# Patient Record
Sex: Male | Born: 1980 | Race: Black or African American | Hispanic: No | Marital: Single | Smoking: Current some day smoker
Health system: Southern US, Community
[De-identification: ages and names within clinical notes are randomized; demographics above are authoritative.]

## PROBLEM LIST (undated history)

## (undated) DIAGNOSIS — I10 Essential (primary) hypertension: Secondary | ICD-10-CM

## (undated) DIAGNOSIS — J45909 Unspecified asthma, uncomplicated: Secondary | ICD-10-CM

---

## 2015-09-13 ENCOUNTER — Emergency Department (HOSPITAL_COMMUNITY)
Admission: EM | Admit: 2015-09-13 | Discharge: 2015-09-13 | Disposition: A | Discharge status: Home / Self Care | Payer: Self-pay | Attending: Emergency Medicine | Admitting: Emergency Medicine

## 2015-09-13 ENCOUNTER — Encounter (HOSPITAL_COMMUNITY): Payer: Self-pay | Admitting: Emergency Medicine

## 2015-09-13 ENCOUNTER — Emergency Department (HOSPITAL_COMMUNITY): Payer: Self-pay

## 2015-09-13 DIAGNOSIS — J45901 Unspecified asthma with (acute) exacerbation: Secondary | ICD-10-CM | POA: Insufficient documentation

## 2015-09-13 DIAGNOSIS — F17210 Nicotine dependence, cigarettes, uncomplicated: Secondary | ICD-10-CM | POA: Insufficient documentation

## 2015-09-13 DIAGNOSIS — Z79899 Other long term (current) drug therapy: Secondary | ICD-10-CM | POA: Insufficient documentation

## 2015-09-13 DIAGNOSIS — I10 Essential (primary) hypertension: Secondary | ICD-10-CM | POA: Insufficient documentation

## 2015-09-13 LAB — I-STAT CHEM 8, ED
BUN: 9 mg/dL (ref 6–20)
CALCIUM ION: 1.19 mmol/L (ref 1.12–1.23)
Chloride: 104 mmol/L (ref 101–111)
Creatinine, Ser: 0.7 mg/dL (ref 0.61–1.24)
GLUCOSE: 97 mg/dL (ref 65–99)
HCT: 53 % — ABNORMAL HIGH (ref 39.0–52.0)
Hemoglobin: 18 g/dL — ABNORMAL HIGH (ref 13.0–17.0)
Potassium: 4.1 mmol/L (ref 3.5–5.1)
SODIUM: 141 mmol/L (ref 135–145)
TCO2: 24 mmol/L (ref 0–100)

## 2015-09-13 MED ORDER — AMLODIPINE BESYLATE 5 MG PO TABS: 5.0000 mg | ORAL_TABLET | Freq: Every day | ORAL | Status: DC

## 2015-09-13 MED ORDER — ALBUTEROL SULFATE HFA 108 (90 BASE) MCG/ACT IN AERS: 1.0000 | INHALATION_SPRAY | Freq: Four times a day (QID) | RESPIRATORY_TRACT | Status: DC | PRN

## 2015-09-13 MED ORDER — ALBUTEROL SULFATE (2.5 MG/3ML) 0.083% IN NEBU
5.0000 mg | INHALATION_SOLUTION | Freq: Once | RESPIRATORY_TRACT | Status: AC
  Administered 2015-09-13: 5 mg via RESPIRATORY_TRACT
  Filled 2015-09-13: qty 6

## 2015-09-13 NOTE — ED Notes (Addendum)
Patient states asthma that started to flare Thursday before Christmas.   Patient states out of medications, states he thought "i had grown out of asthma".  Patient NAD, talking in full sentences.

## 2015-09-13 NOTE — Discharge Instructions (Signed)
You were seen in the ER today for an asthma exacerbation. Your chest x-ray was normal. Your blood pressure has been elevated. I checked some blood work to make sure your blood pressure has not cause kidney damage, and the blood work looks normal. Please follow up with a primary care provider within one week. If you do not have one you may call one of the clinics below to establish care. In the meantime I will start you on a low dose blood pressure medicine. Return to the ER for new or worsening symptoms.  Take medications as prescribed. Return to the emergency room for worsening condition or new concerning symptoms. Follow up with your regular doctor. If you don't have a regular doctor use one of the numbers below to establish a primary care doctor.   Emergency Department Resource Guide 1) Find a Doctor and Pay Out of Pocket Although you won't have to find out who is covered by your insurance plan, it is a good idea to ask around and get recommendations. You will then need to call the office and see if the doctor you have chosen will accept you as a new patient and what types of options they offer for patients who are self-pay. Some doctors offer discounts or will set up payment plans for their patients who do not have insurance, but you will need to ask so you aren't surprised when you get to your appointment.  2) Contact Your Local Health Department Not all health departments have doctors that can see patients for sick visits, but many do, so it is worth a call to see if yours does. If you don't know where your local health department is, you can check in your phone book. The CDC also has a tool to help you locate your state's health department, and many state websites also have listings of all of their local health departments.  3) Find a Walk-in Clinic If your illness is not likely to be very severe or complicated, you may want to try a walk in clinic. These are popping up all over the country in  pharmacies, drugstores, and shopping centers. They're usually staffed by nurse practitioners or physician assistants that have been trained to treat common illnesses and complaints. They're usually fairly quick and inexpensive. However, if you have serious medical issues or chronic medical problems, these are probably not your best option.  No Primary Care Doctor: - Call Health Connect at  [phone] - they can help you locate a primary care doctor that  accepts your insurance, provides certain services, etc. - Physician Referral [phone]- [phone]  Emergency Department Resource Guide 1) Find a Doctor and Pay Out of Pocket Although you won't have to find out who is covered by your insurance plan, it is a good idea to ask around and get recommendations. You will then need to call the office and see if the doctor you have chosen will accept you as a new patient and what types of options they offer for patients who are self-pay. Some doctors offer discounts or will set up payment plans for their patients who do not have insurance, but you will need to ask so you aren't surprised when you get to your appointment.  2) Contact Your Local Health Department Not all health departments have doctors that can see patients for sick visits, but many do, so it is worth a call to see if yours does. If you don't know where your local health department is, you can  check in your phone book. The CDC also has a tool to help you locate your state's health department, and many state websites also have listings of all of their local health departments.  3) Find a Walk-in Clinic If your illness is not likely to be very severe or complicated, you may want to try a walk in clinic. These are popping up all over the country in pharmacies, drugstores, and shopping centers. They're usually staffed by nurse practitioners or physician assistants that have been trained to treat common illnesses and complaints. They're usually fairly  quick and inexpensive. However, if you have serious medical issues or chronic medical problems, these are probably not your best option.  No Primary Care Doctor: - Call Health Connect at  [phone] - they can help you locate a primary care doctor that  accepts your insurance, provides certain services, etc. - Physician Referral Service- [phone]  Chronic Pain Problems: Organization         Address  Phone   Notes  [facility]  [phone] Patients need to be referred by their primary care doctor.   Medication Assistance: Organization         Address  Phone   Notes  [facility] Medication [facility] [address] [place] [place], [place] [address] [phone] --Must be a resident of [facility] -- Must have NO insurance coverage whatsoever (no Medicaid/ Medicare, etc.) -- The pt. MUST have a primary care doctor that directs their care regularly and follows them in the community   [facility]  [phone]   [employer]  [phone]    Agencies that provide inexpensive medical care: Organization         Address  Phone   Notes  [facility]  [phone]   [facility]    [phone]   [facility] [address] [place], [place] [address] [phone]   [facility] [address] [place] [place] [phone]   [facility]    [phone]   [facility]    [phone]   [facility]  [address], [place] Phone:  [phone], Fax:  [phone] Hours of Operation:  9 am - 6 pm, M-F.  Also accepts Medicaid/Medicare and self-pay.  [facility]  [address], [place] Phone: [phone], Fax: [phone]. Hours of Operation:  8:30 am - 5:30 pm, M-F.  Also accepts Medicaid and self-pay.  [facility] [place] [address], [place] Phone: [phone]    [facility] [address] Natasha Bence [place], [place] [phone], Ext. [phone] Mondays & Thursdays: 7-9 AM.  First 15 patients are seen on a first come, first serve basis.    Medicaid-accepting [facility] Providers:  Organization         Address  Phone   Notes  [facility] [address], [place] [phone] Also accepts self-pay patients.  [facility] [address] Laurell Josephs [place], [place]  [phone]   [facility] [address], [place] [phone]   [facility] [address], [place] [phone]   Renaye Rakers [address], [place]   [phone] Only accepts [place] patients after they have their name applied to  their card.   Self-Pay (no insurance) in [facility]:  Organization         Address  Phone   Notes  Sickle Cell Patients, [facility] [address] [place], [place] [phone]   [facility] [address] [place], [place] [phone]   [facility] [place]  [address], [place] [phone]   [facility]/Dr. Osei-Bonsu  [address] [place] or [address], [place] [phone] Phone number for both [place] and [place] locations is the same.  [facility] and [facility] [address], [place] [phone]   [facility] [address], [place] or [address] [phone]   [facility] [address], [place] [phone], phone; [phone], fax Sees patients 1st and 3rd Saturday of every month.  Must not qualify for public or private insurance (i.e. Medicaid, Medicare, [place] Health Choice, Veterans' Benefits)  Household income should be no more than 200% of the poverty level The clinic cannot treat you if you are  pregnant or think you are pregnant  Sexually transmitted diseases are not treated at the clinic.     Hypertension Hypertension, commonly called high blood pressure, is when the force of blood pumping through your arteries is too strong. Your arteries are the blood vessels that carry blood from your heart throughout your body. A blood pressure reading consists of a higher number over a lower number, such as 110/72. The higher number (systolic) is the pressure inside your arteries when your heart pumps. The lower number (diastolic) is the pressure inside your arteries when your heart relaxes. Ideally you want your blood pressure below 120/80. Hypertension forces your heart to work harder to pump blood. Your arteries may become narrow or stiff. Having untreated or uncontrolled hypertension can cause heart attack, stroke, kidney disease, and other problems. RISK FACTORS Some risk factors for high blood pressure are controllable. Others are not.  Risk factors you cannot control include:   Race. You may be at higher risk if you are African American.  Age. Risk increases with age.  Gender. Men are at higher risk than women before age 50 years. After age 45, women are at higher risk than men. Risk factors you can control include:  Not getting enough exercise or physical activity.  Being overweight.  Getting too much fat, sugar, calories, or salt in your diet.  Drinking too much alcohol. SIGNS AND SYMPTOMS Hypertension does not usually cause signs or symptoms. Extremely high blood pressure (hypertensive crisis) may cause headache, anxiety, shortness of breath, and nosebleed. DIAGNOSIS To check if you have hypertension, your health care provider will measure your blood pressure while you are seated, with your arm held at the level of your heart. It should be measured at least twice using the same arm. Certain conditions can cause a difference in blood pressure between your right and left arms. A  blood pressure reading that is higher than normal on one occasion does not mean that you need treatment. If it is not clear whether you have high blood pressure, you may be asked to return on a different day to have your blood pressure checked again. Or, you may be asked to monitor your blood pressure at home for 1 or more weeks. TREATMENT Treating high blood pressure includes making lifestyle changes and possibly taking medicine. Living a  healthy lifestyle can help lower high blood pressure. You may need to change some of your habits. Lifestyle changes may include:  Following the DASH diet. This diet is high in fruits, vegetables, and whole grains. It is low in salt, red meat, and added sugars.  Keep your sodium intake below 2,300 mg per day.  Getting at least 30-45 minutes of aerobic exercise at least 4 times per week.  Losing weight if necessary.  Not smoking.  Limiting alcoholic beverages.  Learning ways to reduce stress. Your health care provider may prescribe medicine if lifestyle changes are not enough to get your blood pressure under control, and if one of the following is true:  You are 53-68 years of age and your systolic blood pressure is above 140.  You are 66 years of age or older, and your systolic blood pressure is above 150.  Your diastolic blood pressure is above 90.  You have diabetes, and your systolic blood pressure is over 140 or your diastolic blood pressure is over 90.  You have kidney disease and your blood pressure is above 140/90.  You have heart disease and your blood pressure is above 140/90. Your personal target blood pressure may vary depending on your medical conditions, your age, and other factors. HOME CARE INSTRUCTIONS  Have your blood pressure rechecked as directed by your health care provider.   Take medicines only as directed by your health care provider. Follow the directions carefully. Blood pressure medicines must be taken as prescribed.  The medicine does not work as well when you skip doses. Skipping doses also puts you at risk for problems.  Do not smoke.   Monitor your blood pressure at home as directed by your health care provider. SEEK MEDICAL CARE IF:   You think you are having a reaction to medicines taken.  You have recurrent headaches or feel dizzy.  You have swelling in your ankles.  You have trouble with your vision. SEEK IMMEDIATE MEDICAL CARE IF:  You develop a severe headache or confusion.  You have unusual weakness, numbness, or feel faint.  You have severe chest or abdominal pain.  You vomit repeatedly.  You have trouble breathing. MAKE SURE YOU:   Understand these instructions.  Will watch your condition.  Will get help right away if you are not doing well or get worse.   This information is not intended to replace advice given to you by your health care provider. Make sure you discuss any questions you have with your health care provider.   Document Released: 08/30/2005 Document Revised: 01/14/2015 Document Reviewed: 06/22/2013 Elsevier Interactive Patient Education [employer]

## 2015-09-16 NOTE — ED Provider Notes (Signed)
CSN: [id number]     Arrival date & time 09/13/15  1208 History   First MD Initiated Contact with Patient 09/13/15 1314     Chief Complaint  Patient presents with  . Asthma     HPI  Shaun Gallegos is an 35 y.o. male with distant history of asthma who presents to the ED for evaluation of asthma. He states that he has not had an inhaler or needed to use one in "many years." However, over christmas he was watching his brother's dog which triggered his asthma. He states he does not feel short of breath but feels like he is wheezing and not breathing quite right. He states he knows his asthma used to be triggered by pets as well. Denies chest pain. Denies cough, fever, chills.   Pt's wife accompanies him today and is concerned about his blood pressure. Pt states he thinks he might have high blood pressure but has never been treated for it. He states that he and his wife just moved to the area from [place] and are in the process of establishing local PCP. Denies chest pain, headache, n/v/. Denies hematuria or other urinary issues.  History reviewed. No pertinent past medical history. History reviewed. No pertinent past surgical history. No family history on file. Social History  Substance Use Topics  . Smoking status: Current Some Day Smoker    Types: Cigars  . Smokeless tobacco: None  . Alcohol Use: No    Review of Systems  All other systems reviewed and are negative.     Allergies  Review of patient's allergies indicates no known allergies.  Home Medications   Prior to Admission medications   Medication Sig Start Date End Date Taking? Authorizing Provider  albuterol (PROVENTIL HFA;VENTOLIN HFA) 108 (90 Base) MCG/ACT inhaler Inhale 1-2 puffs into the lungs every 6 (six) hours as needed for wheezing or shortness of breath. 09/13/15   Shaun GinsSerena Y Deanglo Hissong, PA-C  amLODipine (NORVASC) 5 MG tablet Take 1 tablet (5 mg total) by mouth daily. 09/13/15   Shaun GinsSerena Y Antrell Tipler, PA-C   BP 147/98 mmHg  Pulse 100   Temp(Src) 98.1 F (36.7 C) (Oral)  Resp 17  Ht 6\' 1"  (1.854 m)  Wt 90.719 kg  BMI 26.39 kg/m2  SpO2 99% Physical Exam  Constitutional: He is oriented to person, place, and time. No distress.  HENT:  Right Ear: External ear normal.  Left Ear: External ear normal.  Nose: Nose normal.  Mouth/Throat: Oropharynx is clear and moist. No oropharyngeal exudate.  Eyes: Conjunctivae and EOM are normal. Pupils are equal, round, and reactive to light.  Neck: Normal range of motion. Neck supple.  Cardiovascular: Normal rate, regular rhythm, normal heart sounds and intact distal pulses.   Pulmonary/Chest: Effort normal. He exhibits no tenderness.  Diffuse bilateral expiratory wheezing. No crackles or rhonchi. No increased WOB, accessory muscle usage.   Abdominal: Soft. Bowel sounds are normal. He exhibits no distension. There is no tenderness.  Musculoskeletal: He exhibits no edema.  Neurological: He is alert and oriented to person, place, and time. No cranial nerve deficit.  Skin: Skin is warm and dry. He is not diaphoretic.  Psychiatric: He has a normal mood and affect.  Nursing note and vitals reviewed.   Filed Vitals:   09/13/15 1231 09/13/15 1431 09/13/15 1524  BP: 156/106 163/108 147/98  Pulse: 100 97 100  Temp: 98.1 F (36.7 C)    TempSrc: Oral    Resp: 18 18 17   Height: 6\' 1"  (  1.854 m)    Weight: 90.719 kg    SpO2: 96% 96% 99%     ED Course  Procedures (including critical care time) Labs Review Labs Reviewed  I-STAT CHEM 8, ED - Abnormal; Notable for the following:    Hemoglobin 18.0 (*)    HCT 53.0 (*)    All other components within normal limits    Imaging Review No results found. I have personally reviewed and evaluated these images and lab results as part of my medical decision-making.   EKG Interpretation None      MDM   Final diagnoses:  Asthma, unspecified asthma severity, with acute exacerbation  Essential hypertension   Pt's presentation consistent  with asthma exacerbation. His wheezing cleared completely with albuterol neb and pt subjectively feels much better. He has not been hypoxic at all. His tachycardia is likely due to albuterol neb. Will give rx for ventolin inhaler. Pt's BP has been elevated today. No evidence of end organ damage. Pt is asymptomatic. I will start him on a low dose of Norvasc and give resource guide to establish PCP. ER return precautions given.   Carlene Coria, PA-C 09/16/15 1312  Cathren Laine, MD 09/16/15 [phone]

## 2016-10-20 ENCOUNTER — Emergency Department (HOSPITAL_COMMUNITY): Payer: Self-pay

## 2016-10-20 ENCOUNTER — Encounter (HOSPITAL_COMMUNITY): Payer: Self-pay | Admitting: *Deleted

## 2016-10-20 ENCOUNTER — Emergency Department (HOSPITAL_COMMUNITY)
Admission: EM | Admit: 2016-10-20 | Discharge: 2016-10-20 | Disposition: A | Discharge status: Home / Self Care | Payer: Self-pay | Attending: Emergency Medicine | Admitting: Emergency Medicine

## 2016-10-20 DIAGNOSIS — I10 Essential (primary) hypertension: Secondary | ICD-10-CM | POA: Insufficient documentation

## 2016-10-20 DIAGNOSIS — Z79899 Other long term (current) drug therapy: Secondary | ICD-10-CM | POA: Insufficient documentation

## 2016-10-20 DIAGNOSIS — F17290 Nicotine dependence, other tobacco product, uncomplicated: Secondary | ICD-10-CM | POA: Insufficient documentation

## 2016-10-20 DIAGNOSIS — J4521 Mild intermittent asthma with (acute) exacerbation: Secondary | ICD-10-CM | POA: Insufficient documentation

## 2016-10-20 HISTORY — DX: Essential (primary) hypertension: I10

## 2016-10-20 HISTORY — DX: Unspecified asthma, uncomplicated: J45.909

## 2016-10-20 MED ORDER — IPRATROPIUM-ALBUTEROL 0.5-2.5 (3) MG/3ML IN SOLN
RESPIRATORY_TRACT | Status: AC
  Filled 2016-10-20: qty 3

## 2016-10-20 MED ORDER — PREDNISONE 20 MG PO TABS
60.0000 mg | ORAL_TABLET | Freq: Once | ORAL | Status: AC
  Administered 2016-10-20: 60 mg via ORAL
  Filled 2016-10-20: qty 3

## 2016-10-20 MED ORDER — ALBUTEROL SULFATE HFA 108 (90 BASE) MCG/ACT IN AERS: 1.0000 | INHALATION_SPRAY | Freq: Four times a day (QID) | RESPIRATORY_TRACT | 0 refills | Status: DC | PRN

## 2016-10-20 MED ORDER — PREDNISONE 20 MG PO TABS: 40.0000 mg | ORAL_TABLET | Freq: Every day | ORAL | 0 refills | Status: DC

## 2016-10-20 MED ORDER — AMLODIPINE BESYLATE 5 MG PO TABS: 5.0000 mg | ORAL_TABLET | Freq: Every day | ORAL | 0 refills | Status: AC

## 2016-10-20 MED ORDER — IPRATROPIUM-ALBUTEROL 0.5-2.5 (3) MG/3ML IN SOLN
3.0000 mL | Freq: Once | RESPIRATORY_TRACT | Status: AC
  Administered 2016-10-20: 3 mL via RESPIRATORY_TRACT

## 2016-10-20 MED ORDER — ALBUTEROL SULFATE HFA 108 (90 BASE) MCG/ACT IN AERS
2.0000 | INHALATION_SPRAY | Freq: Once | RESPIRATORY_TRACT | Status: AC
  Administered 2016-10-20: 2 via RESPIRATORY_TRACT
  Filled 2016-10-20: qty 6.7

## 2016-10-20 NOTE — ED Notes (Signed)
Pt stable, understands discharge instructions, and reasons for return.   

## 2016-10-20 NOTE — ED Triage Notes (Signed)
No wheezing noted after treatment

## 2016-10-20 NOTE — ED Notes (Signed)
No signature pad available 

## 2016-10-20 NOTE — ED Triage Notes (Signed)
Pt reports wheezing and asthma starting several days ago. Pt states that he has a dr appt on the 12th. Pt reports having to use a friends inhaler. Inspiratory and expiratory wheezing noted.

## 2016-10-20 NOTE — Discharge Instructions (Signed)
Take the prednisone 2 tablets a day starting tomorrow for 4 days. Use the albuterol inhaler as needed. I have given you a prescription for your blood pressure medicine. Please follow-up with her primary care doctor next week. Return to the ED if he develops any worsening symptoms.   Follow these instructions at home: Take over-the-counter and prescription medicines only as told by your health care provider. Use a cool mist humidifier to add humidity to the air in your home. This can make breathing easier. Rest as needed. Drink enough fluid to keep your urine clear or pale yellow. Cover your mouth and nose when you cough or sneeze. Wash your hands with soap and water often, especially after you cough or sneeze. If soap and water are not available, use hand sanitizer. Stay home from work or school as told by your health care provider. Unless you are visiting your health care provider, try to avoid leaving home until your fever has been gone for 24 hours without the use of medicine. Keep all follow-up visits as told by your health care provider. This is important. Contact a health care provider if: You develop new symptoms. You have: Chest pain. Diarrhea. A fever. Your cough gets worse. You produce more mucus. You feel nauseous or you vomit. Get help right away if: You develop shortness of breath or difficulty breathing. Your skin or nails turn a bluish color. You have severe pain or stiffness in your neck. You develop a sudden headache or sudden pain in your face or ear. You cannot stop vomiting.

## 2016-10-20 NOTE — ED Provider Notes (Signed)
[facility]EMER[facility]ENCY DEPT Provider Note   CSN: [id number] Arrival date & time: 10/20/16  1217     History   Chief Complaint Chief Complaint  Patient presents with  . Asthma    HPI Shaun Gallegos is a 36 y.o. male.  36 year old African-American male past medical history significant for asthma and hypertension presents to the ED today with complaint of wheezing and exacerbation of his asthma. States that his symptoms started approximately 2 days ago when he was watching the Super Bowl at his dad's house. States there is smoking in the house and he has dogs which triggered his asthma. Patient states that his chest has been intermittently tight with increased wheezing. States that he has not seen a primary care doctor for his asthma and "a few years". Does not have an albuterol inhaler at home. He has been using a friend's inhaler with some relief. States this feels like his typical asthma exacerbation. Hasn't felt fine up until his symptoms were triggered the other day. Patient also states she's been out of his blood pressure medicine for the past 3 days. He has an appointment with his primary care doctor on Monday for his asthma and hypertension. Patient denies any fever, chills, headache, vision changes, chest pain, shortness of breath, abdominal pain, nausea, vomiting, urinary symptoms, change in bowel habits. The patient was noted to have expiratory Wheezes in triage. He was given a DuoNeb with complete resolution of his lung sounds. Patient states he feels completely resolved on my initial assessment.      Past Medical History:  Diagnosis Date  . Asthma   . Hypertension     There are no active problems to display for this patient.   History reviewed. No pertinent surgical history.     Home Medications    Prior to Admission medications   Medication Sig Start Date End Date Taking? Authorizing Provider  albuterol (PROVENTIL HFA;VENTOLIN HFA) 108 (90 Base) MCG/ACT inhaler Inhale 1-2  puffs into the lungs every 6 (six) hours as needed for wheezing or shortness of breath. 10/20/16   Rise MuKenneth T Belinda Schlichting, PA-C  amLODipine (NORVASC) 5 MG tablet Take 1 tablet (5 mg total) by mouth daily. 10/20/16   Rise MuKenneth T Tearsa Kowalewski, PA-C  predniSONE (DELTASONE) 20 MG tablet Take 2 tablets (40 mg total) by mouth daily with breakfast. 10/20/16   Rise MuKenneth T Javeon Macmurray, PA-C    Family History No family history on file.  Social History Social History  Substance Use Topics  . Smoking status: Current Some Day Smoker    Types: Cigars  . Smokeless tobacco: Not on file  . Alcohol use No     Allergies   Patient has no known allergies.   Review of Systems Review of Systems  Constitutional: Negative for chills and fever.  HENT: Negative for congestion and sore throat.   Eyes: Negative for visual disturbance.  Respiratory: Positive for chest tightness and wheezing. Negative for cough and shortness of breath.   Cardiovascular: Negative for chest pain and palpitations.  Gastrointestinal: Negative for abdominal pain, diarrhea, nausea and vomiting.  Genitourinary: Negative for dysuria, flank pain, hematuria and urgency.  Musculoskeletal: Negative.   Skin: Negative.   Neurological: Negative for dizziness, syncope, weakness, light-headedness and headaches.  All other systems reviewed and are negative.    Physical Exam Updated Vital Signs BP (!) 154/110 (BP Location: Right Arm)   Pulse 91   Temp 98.7 F (37.1 C) (Oral)   Resp 16   SpO2 96%   Physical Exam  Constitutional: He is oriented to person, place, and time. He appears well-developed and well-nourished. No distress.  HENT:  Head: Normocephalic and atraumatic.  Right Ear: Tympanic membrane, external ear and ear canal normal.  Left Ear: Tympanic membrane, external ear and ear canal normal.  Nose: Nose normal.  Mouth/Throat: Uvula is midline, oropharynx is clear and moist and mucous membranes are normal.  Eyes: Conjunctivae are normal.  Right eye exhibits no discharge. Left eye exhibits no discharge. No scleral icterus.  Neck: Normal range of motion. Neck supple. No thyromegaly present.  Cardiovascular: Normal rate, regular rhythm, normal heart sounds and intact distal pulses.  Exam reveals no gallop and no friction rub.   No murmur heard. Patient with a heart rate of 85 on my exam.  Pulmonary/Chest: Effort normal and breath sounds normal. No respiratory distress. He has no wheezes. He exhibits no tenderness.  Patient was not hypoxic or tachypnea, my exam. No respiratory or expiratory wheezes were noted. No rhonchi or crackles. No retractions. No accessory muscle use. Patient is not acute distress. Speaking complete sentences and unlabored.  Abdominal: Soft. Bowel sounds are normal. He exhibits no distension. There is no tenderness. There is no rebound and no guarding.  Musculoskeletal: Normal range of motion.  Lymphadenopathy:    He has no cervical adenopathy.  Neurological: He is alert and oriented to person, place, and time.  Skin: Skin is warm and dry. Capillary refill takes less than 2 seconds.  Nursing note and vitals reviewed.    ED Treatments / Results  Labs (all labs ordered are listed, but only abnormal results are displayed) Labs Reviewed - No data to display  EKG  EKG Interpretation None       Radiology Dg Chest 2 View  Result Date: 10/20/2016 CLINICAL DATA:  Shortness of breath. EXAM: CHEST  2 VIEW COMPARISON:  Radiographs of September 13, 2015. FINDINGS: The heart size and mediastinal contours are within normal limits. Both lungs are clear. No pneumothorax or pleural effusion is noted. The visualized skeletal structures are unremarkable. IMPRESSION: No active cardiopulmonary disease. Electronically Signed   By: Lupita Raider, M.D.   On: 10/20/2016 16:00    Procedures Procedures (including critical care time)  Medications Ordered in ED Medications  ipratropium-albuterol (DUONEB) 0.5-2.5 (3)  MG/3ML nebulizer solution 3 mL (3 mLs Nebulization Given 10/20/16 1241)  predniSONE (DELTASONE) tablet 60 mg (60 mg Oral Given 10/20/16 1600)  albuterol (PROVENTIL HFA;VENTOLIN HFA) 108 (90 Base) MCG/ACT inhaler 2 puff (2 puffs Inhalation Given 10/20/16 1600)     Initial Impression / Assessment and Plan / ED Course  I have reviewed the triage vital signs and the nursing notes.  Pertinent labs & imaging results that were available during my care of the patient were reviewed by me and considered in my medical decision making (see chart for details).     Patient presents to the ED with complaints of wheezing and asthma exacerbation. States that his symptoms were triggered when he was around his family and smoke the other day. States this feels like his typical asthma exacerbation. Did not have any inhaler at home. The patient with respiratory neck sprain wheezes in triage. Given a DuoNeb with complete resolution of symptoms. Patient states he feels much improved. I ambulate her patient myself with oxygen saturation of 100%. Patient ambulated in ED with O2 saturations maintained >90, no current signs of respiratory distress. Lung exam improved after nebulizer treatment. Prednisone given in the ED and pt will bd dc with  4 day burst. Pt states they are breathing at baseline. Pt has been instructed to continue using prescribed medications and to speak with PCP about today's exacerbation. Blood pressure is slightly elevated. Patient states he has not taken his blood pressure medicine the past 2 days. He has been out of his medications. Asking for refill. He denies any chest pain, headaches, vision changes or shortness of breath. No signs of end organ damage. We'll give him a 30 day supplies medication. He is following up with his primary care doctor on Monday for his first appointment to establish care.  Vital signs within normal limits. Pt is hemodynamically stable, in NAD, & able to ambulate in the ED. Pain has  been managed & has no complaints prior to dc. Pt is comfortable with above plan and is stable for discharge at this time. All questions were answered prior to disposition. Strict return precautions for f/u to the ED were discussed.    Final Clinical Impressions(s) / ED Diagnoses   Final diagnoses:  Mild intermittent asthma with exacerbation    New Prescriptions Discharge Medication List as of 10/20/2016  4:09 PM    START taking these medications   Details  predniSONE (DELTASONE) 20 MG tablet Take 2 tablets (40 mg total) by mouth daily with breakfast., Starting Wed 10/20/2016, Print         Rise Mu, PA-C 10/20/16 1640    Raeford Razor, MD 11/01/16 [phone]

## 2017-01-25 IMAGING — CR DG CHEST 2V
2 series · 2 of 2 positions shown · non-contrast
Comparison: None.

CLINICAL DATA: Initial encounter for chest congestion since
[REDACTED].

EXAM:
CHEST  2 VIEW

[chest pa]
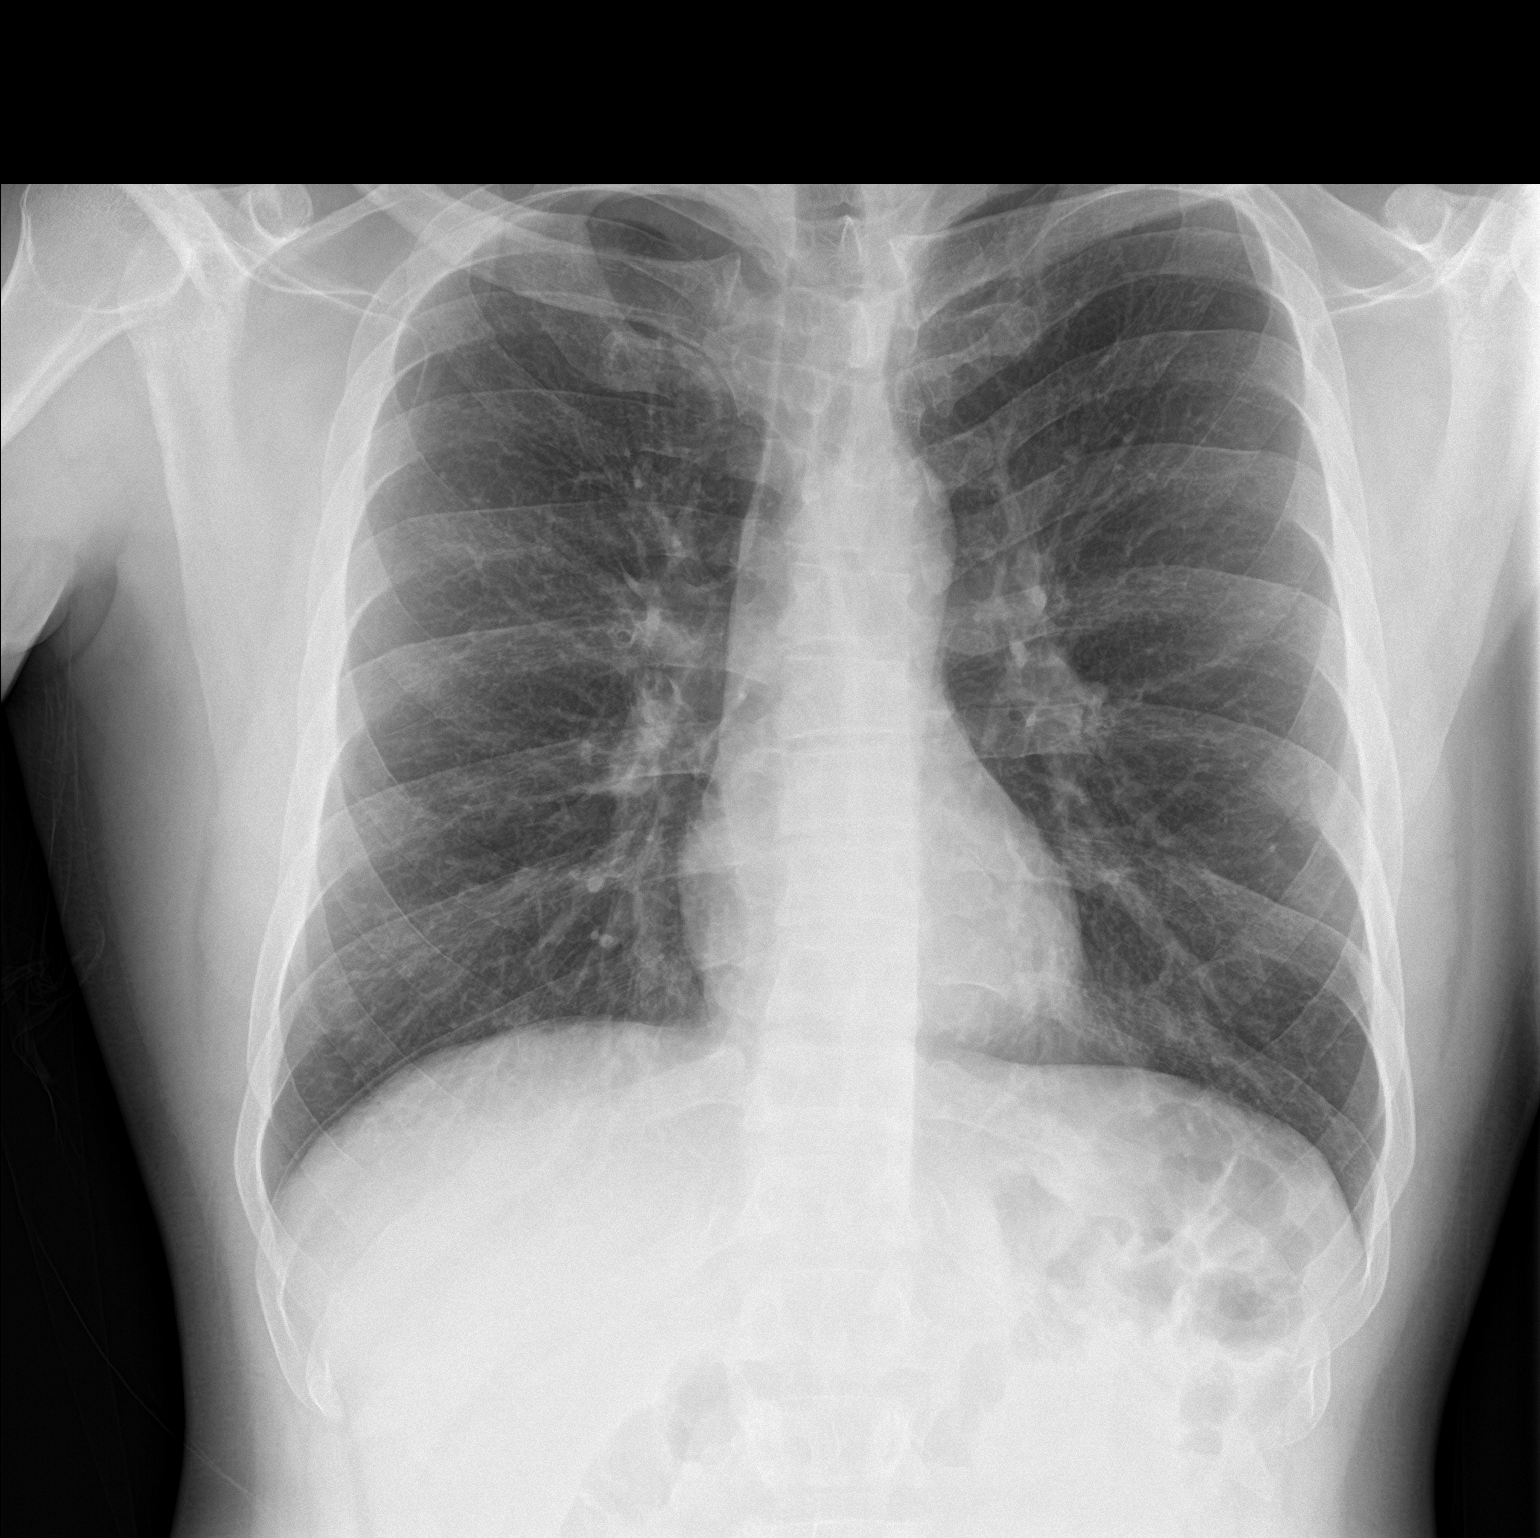

[chest lat]
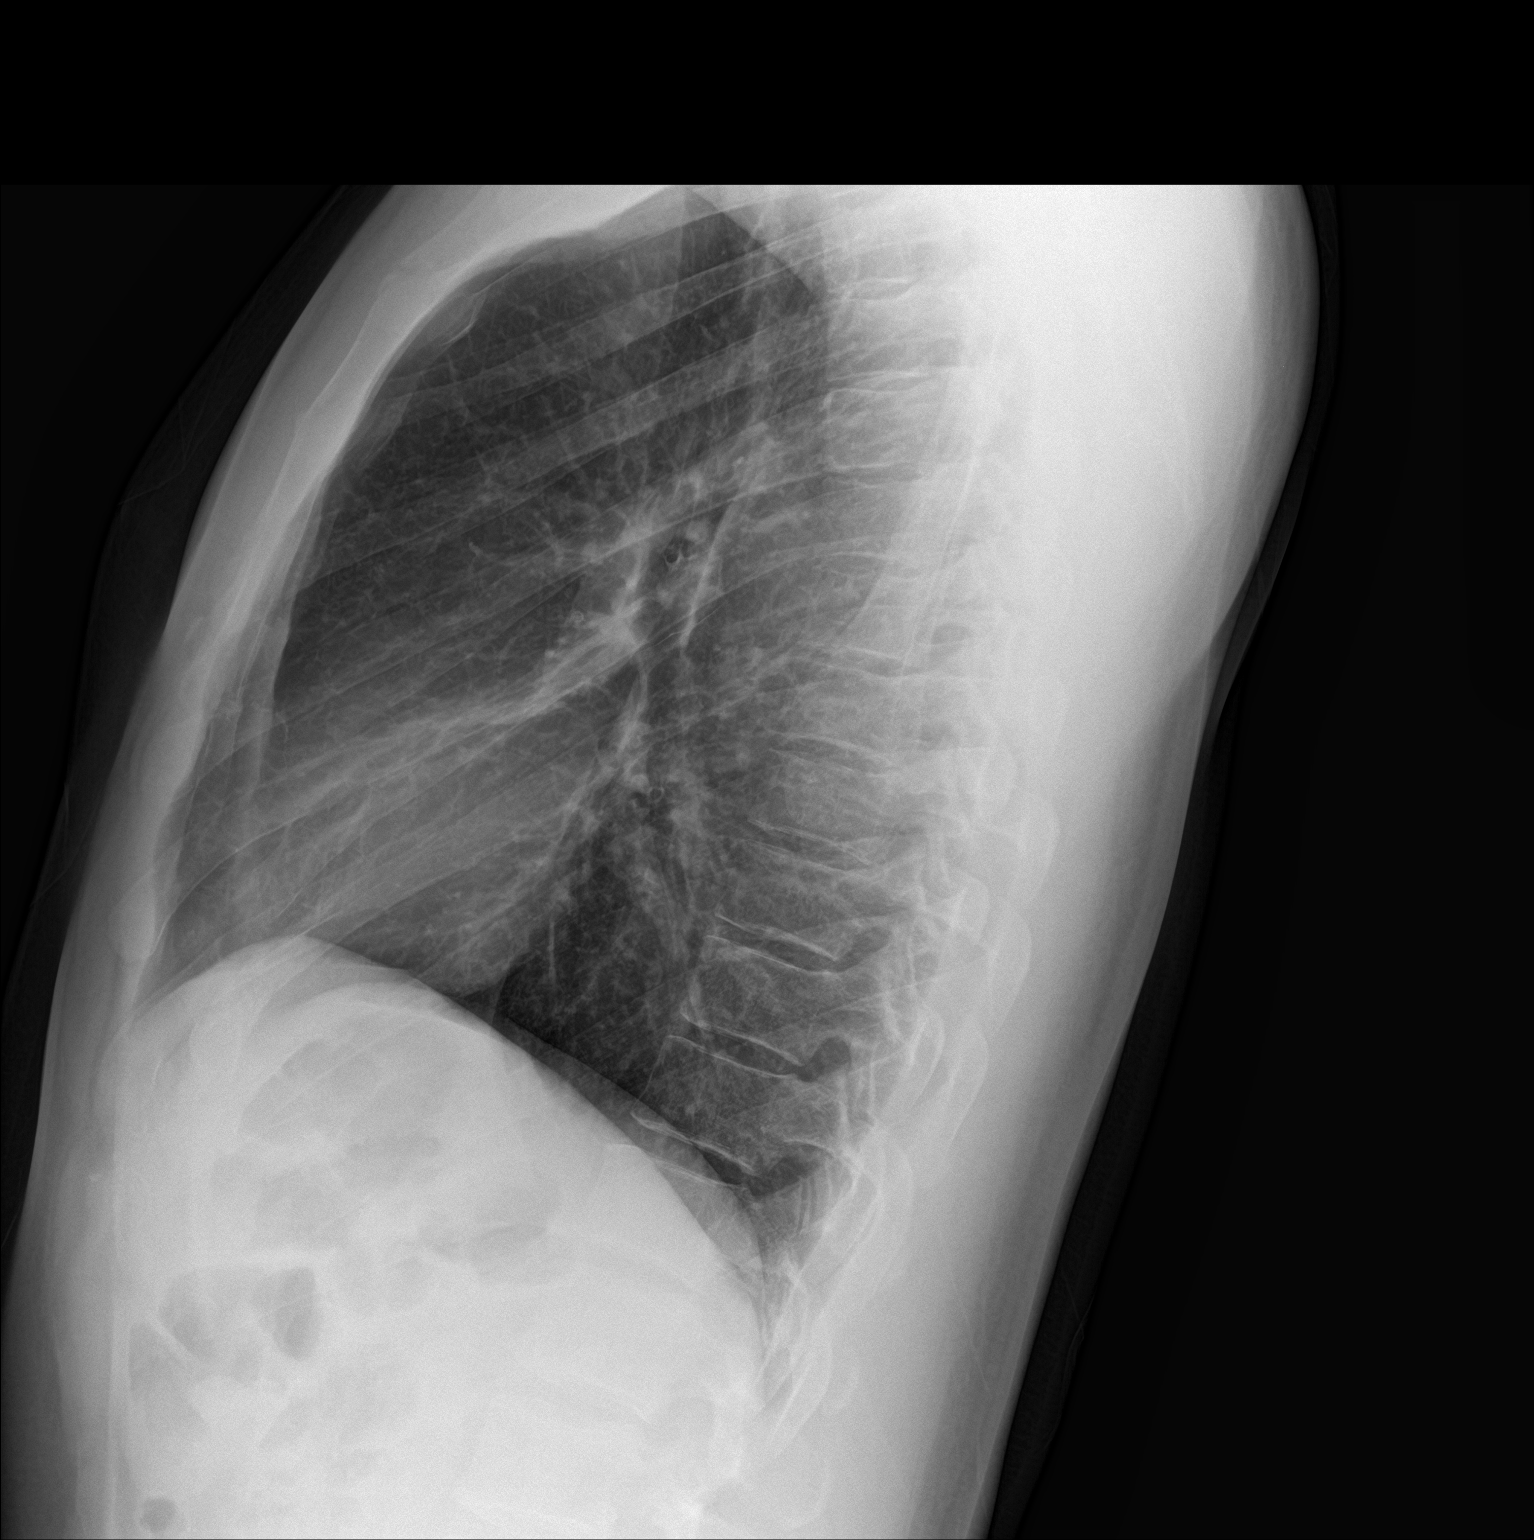

[2 of 2 positions shown; findings below may reference images not displayed]

FINDINGS: The heart size and mediastinal contours are within normal limits.
Both lungs are clear. The visualized skeletal structures are
unremarkable.
IMPRESSION: No active cardiopulmonary disease.

## 2017-02-16 ENCOUNTER — Emergency Department (HOSPITAL_COMMUNITY): Payer: Self-pay

## 2017-02-16 ENCOUNTER — Emergency Department (HOSPITAL_COMMUNITY)
Admission: EM | Admit: 2017-02-16 | Discharge: 2017-02-16 | Disposition: A | Discharge status: Home / Self Care | Payer: Self-pay | Attending: Emergency Medicine | Admitting: Emergency Medicine

## 2017-02-16 ENCOUNTER — Encounter (HOSPITAL_COMMUNITY): Payer: Self-pay

## 2017-02-16 DIAGNOSIS — F17290 Nicotine dependence, other tobacco product, uncomplicated: Secondary | ICD-10-CM | POA: Insufficient documentation

## 2017-02-16 DIAGNOSIS — Z79899 Other long term (current) drug therapy: Secondary | ICD-10-CM | POA: Insufficient documentation

## 2017-02-16 DIAGNOSIS — J45901 Unspecified asthma with (acute) exacerbation: Secondary | ICD-10-CM | POA: Insufficient documentation

## 2017-02-16 DIAGNOSIS — I10 Essential (primary) hypertension: Secondary | ICD-10-CM | POA: Insufficient documentation

## 2017-02-16 LAB — BASIC METABOLIC PANEL
Anion gap: 9 (ref 5–15)
BUN: 8 mg/dL (ref 6–20)
CALCIUM: 9.2 mg/dL (ref 8.9–10.3)
CO2: 23 mmol/L (ref 22–32)
CREATININE: 0.87 mg/dL (ref 0.61–1.24)
Chloride: 107 mmol/L (ref 101–111)
GFR calc non Af Amer: >60 mL/min (ref >60)
GLUCOSE: 108 mg/dL — AB (ref 65–99)
Potassium: 3.5 mmol/L (ref 3.5–5.1)
Sodium: 139 mmol/L (ref 135–145)

## 2017-02-16 LAB — CBC
HEMATOCRIT: 43 % (ref 39.0–52.0)
Hemoglobin: 14.4 g/dL (ref 13.0–17.0)
MCH: 27.2 pg (ref 26.0–34.0)
MCHC: 33.5 g/dL (ref 30.0–36.0)
MCV: 81.3 fL (ref 78.0–100.0)
Platelets: 279 10*3/uL (ref 150–400)
RBC: 5.29 MIL/uL (ref 4.22–5.81)
RDW: 14 % (ref 11.5–15.5)
WBC: 9.3 10*3/uL (ref 4.0–10.5)

## 2017-02-16 MED ORDER — ALBUTEROL SULFATE (2.5 MG/3ML) 0.083% IN NEBU
5.0000 mg | INHALATION_SOLUTION | Freq: Once | RESPIRATORY_TRACT | Status: AC
  Administered 2017-02-16 (×2): 5 mg via RESPIRATORY_TRACT

## 2017-02-16 MED ORDER — METHYLPREDNISOLONE SODIUM SUCC 125 MG IJ SOLR
125.0000 mg | Freq: Once | INTRAMUSCULAR | Status: AC
  Administered 2017-02-16: 125 mg via INTRAVENOUS
  Filled 2017-02-16: qty 2

## 2017-02-16 MED ORDER — ALBUTEROL SULFATE HFA 108 (90 BASE) MCG/ACT IN AERS: 1.0000 | INHALATION_SPRAY | Freq: Four times a day (QID) | RESPIRATORY_TRACT | 0 refills | Status: AC | PRN

## 2017-02-16 MED ORDER — SODIUM CHLORIDE 0.9 % IV BOLUS (SEPSIS)
1000.0000 mL | Freq: Once | INTRAVENOUS | Status: AC
  Administered 2017-02-16: 1000 mL via INTRAVENOUS

## 2017-02-16 MED ORDER — PREDNISONE 10 MG PO TABS: 20.0000 mg | ORAL_TABLET | Freq: Every day | ORAL | 0 refills | Status: AC

## 2017-02-16 MED ORDER — ALBUTEROL (5 MG/ML) CONTINUOUS INHALATION SOLN
10.0000 mg/h | INHALATION_SOLUTION | Freq: Once | RESPIRATORY_TRACT | Status: AC
  Administered 2017-02-16: 10 mg/h via RESPIRATORY_TRACT
  Filled 2017-02-16: qty 20

## 2017-02-16 MED ORDER — ALBUTEROL SULFATE (2.5 MG/3ML) 0.083% IN NEBU
INHALATION_SOLUTION | RESPIRATORY_TRACT | Status: AC
  Administered 2017-02-16: 5 mg via RESPIRATORY_TRACT
  Filled 2017-02-16: qty 6

## 2017-02-16 NOTE — ED Notes (Signed)
Pt to xray

## 2017-02-16 NOTE — ED Provider Notes (Signed)
[facility] Provider Note   CSN: [id number] Arrival date & time: 02/16/17  4010     History   Chief Complaint No chief complaint on file.   HPI Shaun Gallegos is a 36 y.o. male.  HPI  36 y.o. male with a hx of HTN, Asthma, presents to the Emergency Department today complaining of shortness of breath x 2 days. Hx same. Working outside this AM and felt asthma attack after yard work. Does not have inhaler currently. No N/V. No CP/ABD pain. No fevers. No URI symptoms. Denies pain currently. No meds PTA. No other symptoms noted.   Past Medical History:  Diagnosis Date  . Asthma   . Hypertension     There are no active problems to display for this patient.   History reviewed. No pertinent surgical history.     Home Medications    Prior to Admission medications   Medication Sig Start Date End Date Taking? Authorizing Provider  albuterol (PROVENTIL HFA;VENTOLIN HFA) 108 (90 Base) MCG/ACT inhaler Inhale 1-2 puffs into the lungs every 6 (six) hours as needed for wheezing or shortness of breath. 10/20/16   Rise Mu, PA-C  amLODipine (NORVASC) 5 MG tablet Take 1 tablet (5 mg total) by mouth daily. 10/20/16   Rise Mu, PA-C  predniSONE (DELTASONE) 20 MG tablet Take 2 tablets (40 mg total) by mouth daily with breakfast. 10/20/16   Rise Mu, PA-C    Family History No family history on file.  Social History Social History  Substance Use Topics  . Smoking status: Current Some Day Smoker    Types: Cigars  . Smokeless tobacco: Not on file  . Alcohol use No     Allergies   Patient has no known allergies.   Review of Systems Review of Systems ROS reviewed and all are negative for acute change except as noted in the HPI.  Physical Exam Updated Vital Signs BP (!) 156/110   Pulse 92   Temp 97.7 F (36.5 C) (Oral)   Resp 20   SpO2 97%   Physical Exam  Constitutional: He is oriented to person, place, and time. He appears well-developed  and well-nourished. No distress.  HENT:  Head: Normocephalic and atraumatic.  Right Ear: Tympanic membrane, external ear and ear canal normal.  Left Ear: Tympanic membrane, external ear and ear canal normal.  Nose: Nose normal.  Mouth/Throat: Uvula is midline, oropharynx is clear and moist and mucous membranes are normal. No trismus in the jaw. No oropharyngeal exudate, posterior oropharyngeal erythema or tonsillar abscesses.  Eyes: EOM are normal. Pupils are equal, round, and reactive to light.  Neck: Normal range of motion. Neck supple. No tracheal deviation present.  Cardiovascular: Normal rate, regular rhythm, S1 normal, S2 normal, normal heart sounds, intact distal pulses and normal pulses.   Pulmonary/Chest: Effort normal. No respiratory distress. He has no decreased breath sounds. He has wheezes in the right upper field, the right lower field, the left upper field and the left lower field. He has no rhonchi. He has no rales.  Abdominal: Normal appearance and bowel sounds are normal. There is no tenderness.  Musculoskeletal: Normal range of motion.  Neurological: He is alert and oriented to person, place, and time.  Skin: Skin is warm and dry.  Psychiatric: He has a normal mood and affect. His speech is normal and behavior is normal. Thought content normal.     ED Treatments / Results  Labs (all labs ordered are listed, but only abnormal  results are displayed) Labs Reviewed - No data to display  EKG  EKG Interpretation None       Radiology No results found.  Procedures Procedures (including critical care time)  Medications Ordered in ED Medications  albuterol (PROVENTIL) (2.5 MG/3ML) 0.083% nebulizer solution 5 mg (5 mg Nebulization Given 02/16/17 1000)     Initial Impression / Assessment and Plan / ED Course  I have reviewed the triage vital signs and the nursing notes.  Pertinent labs & imaging results that were available during my care of the patient were reviewed  by me and considered in my medical decision making (see chart for details).  Final Clinical Impressions(s) / ED Diagnoses  {I have reviewed and evaluated the relevant laboratory values. {I have reviewed and evaluated the relevant imaging studies.  {I have reviewed the relevant previous healthcare records.  {I obtained HPI from historian.   ED Course:  Assessment: Pt is a 36 y.o. male with hx Asthma who presents with Asthma exacerbation x 1-2 days. No fevers. On exam, pt in NAD. Nontoxic/nonseptic appearing. VSS. Afebrile. Lungs with bilateral wheeze. Heart RRR. Abdomen nontender soft. CXR unremarkable. Given albuterol, steroids in ED with improvement. Plan is to DC Home with follow up to PCP. Given Rx Steroids and Albuterol. At time of discharge, Patient is in no acute distress. Vital Signs are stable. Patient is able to ambulate. Patient able to tolerate PO.   Disposition/Plan:  DC Home Additional Verbal discharge instructions given and discussed with patient.  Pt Instructed to f/u with PCP in the next week for evaluation and treatment of symptoms. Return precautions given Pt acknowledges and agrees with plan  Supervising Physician Arby BarrettePfeiffer, Marcy, MD  Final diagnoses:  Moderate asthma with exacerbation, unspecified whether persistent    New Prescriptions New Prescriptions   No medications on file     Audry PiliMohr, Octavia Mottola, Cordelia Poche-C 02/16/17 1231    Arby BarrettePfeiffer, Marcy, MD 02/21/17 [phone]

## 2017-02-16 NOTE — Discharge Instructions (Signed)
Please read and follow all provided instructions.  Your diagnoses today include:  1. Moderate asthma with exacerbation, unspecified whether persistent     Tests performed today include: Vital signs. See below for your results today.   Medications prescribed:   Take any prescribed medications only as directed.  Home care instructions:  Follow any educational materials contained in this packet.  Follow-up instructions: Please follow-up with your primary care provider in the next 3 days for further evaluation of your symptoms and management of your asthma.  Return instructions:  Please return to the Emergency Department if you experience worsening symptoms. Please return with worsening wheezing, shortness of breath, or difficulty breathing. Return with persistent fever above 101F.  Please return if you have any other emergent concerns.  Additional Information:  Your vital signs today were: BP (!) 169/113    Pulse 96    Temp 97.7 F (36.5 C) (Oral)    Resp 20    SpO2 98%  If your blood pressure (BP) was elevated above 135/85 this visit, please have this repeated by your doctor within one month. --------------

## 2017-02-16 NOTE — ED Triage Notes (Signed)
Patient complains of asthma attack after working outside this am. Reports out of inhaler. States that his lungs are tight, smoker. Speaking full sentences.

## 2018-03-04 IMAGING — DX DG CHEST 2V
2 series · 2 of 2 positions shown · non-contrast
Comparison: Radiographs September 13, 2015.

CLINICAL DATA: Shortness of breath.

EXAM:
CHEST  2 VIEW

[chest pa]
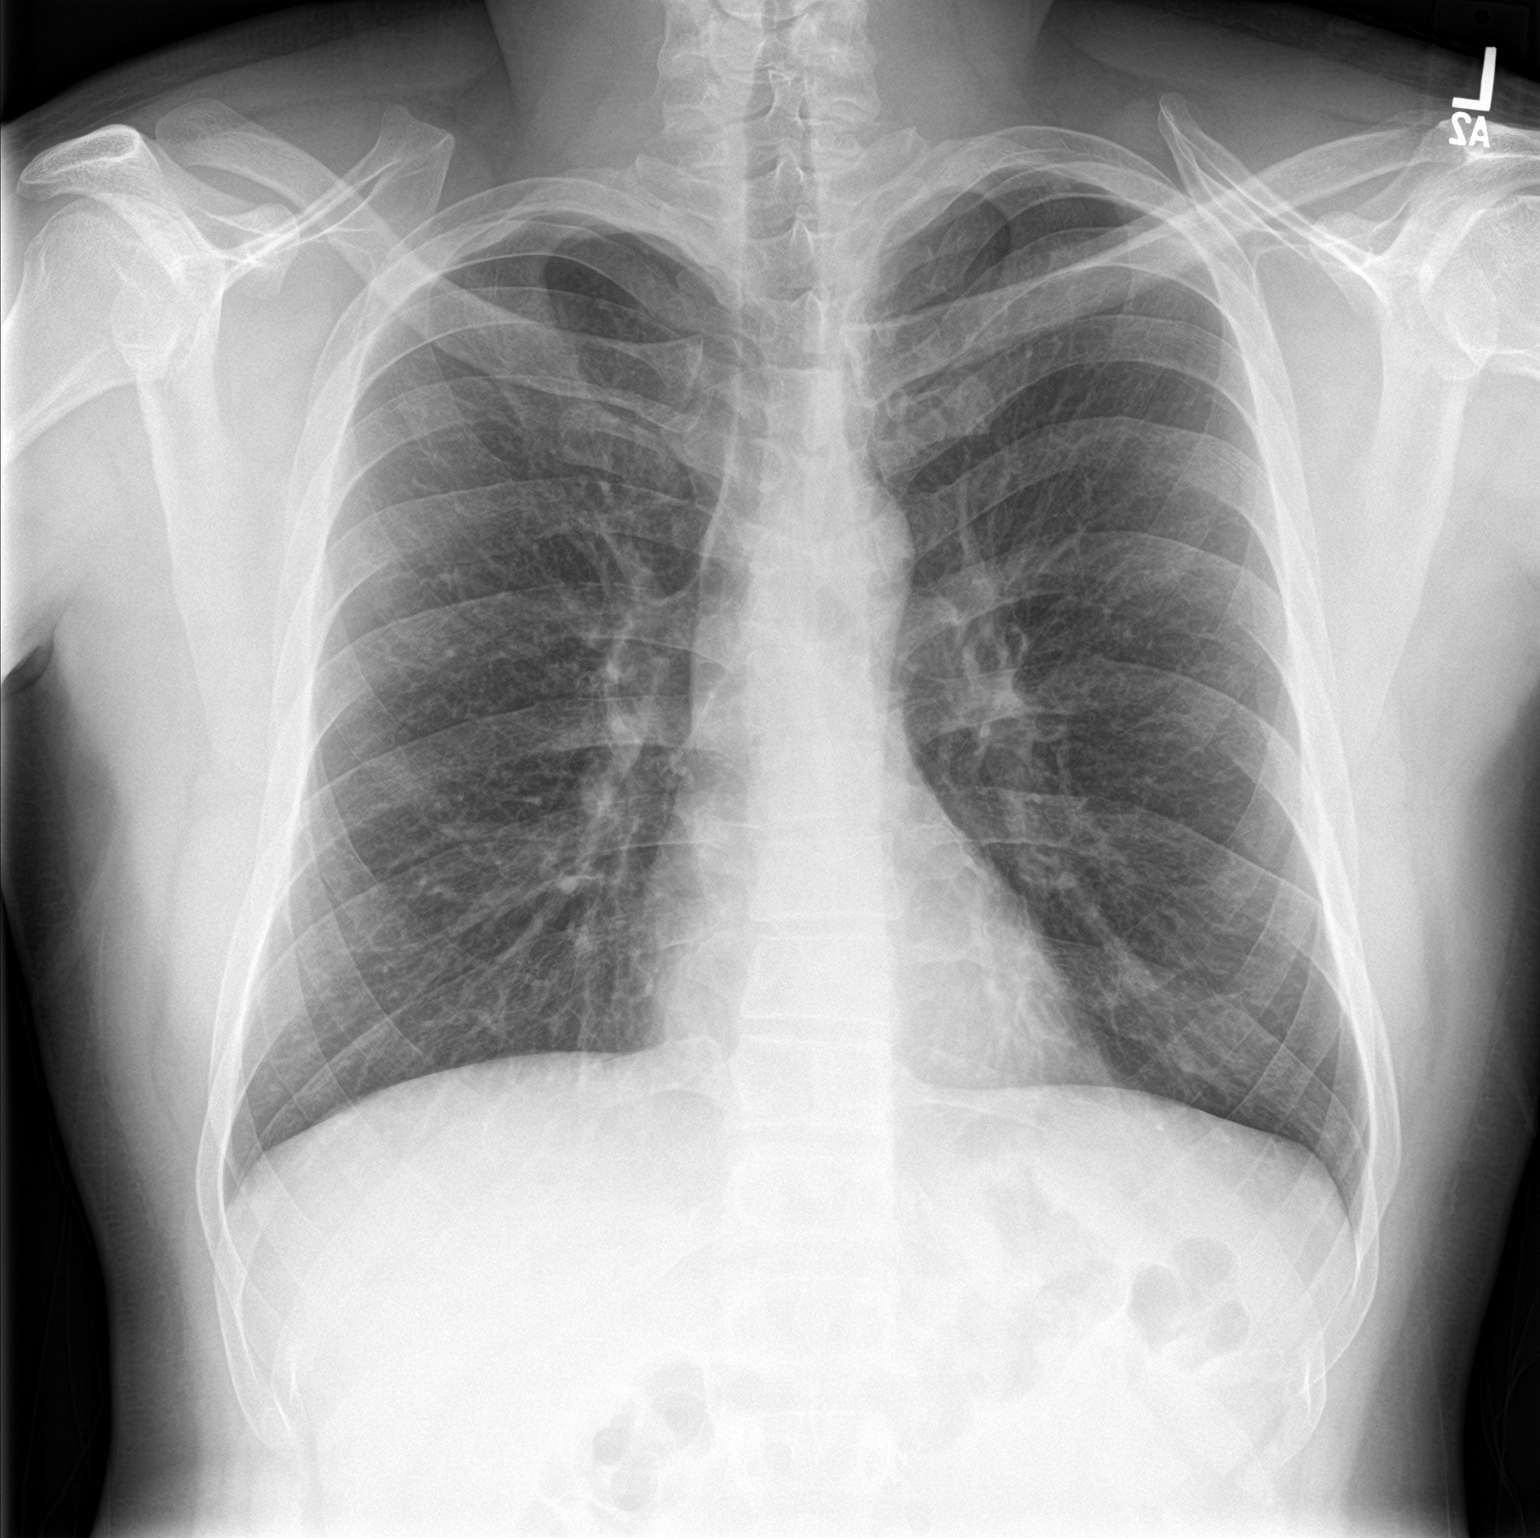

[chest lat]
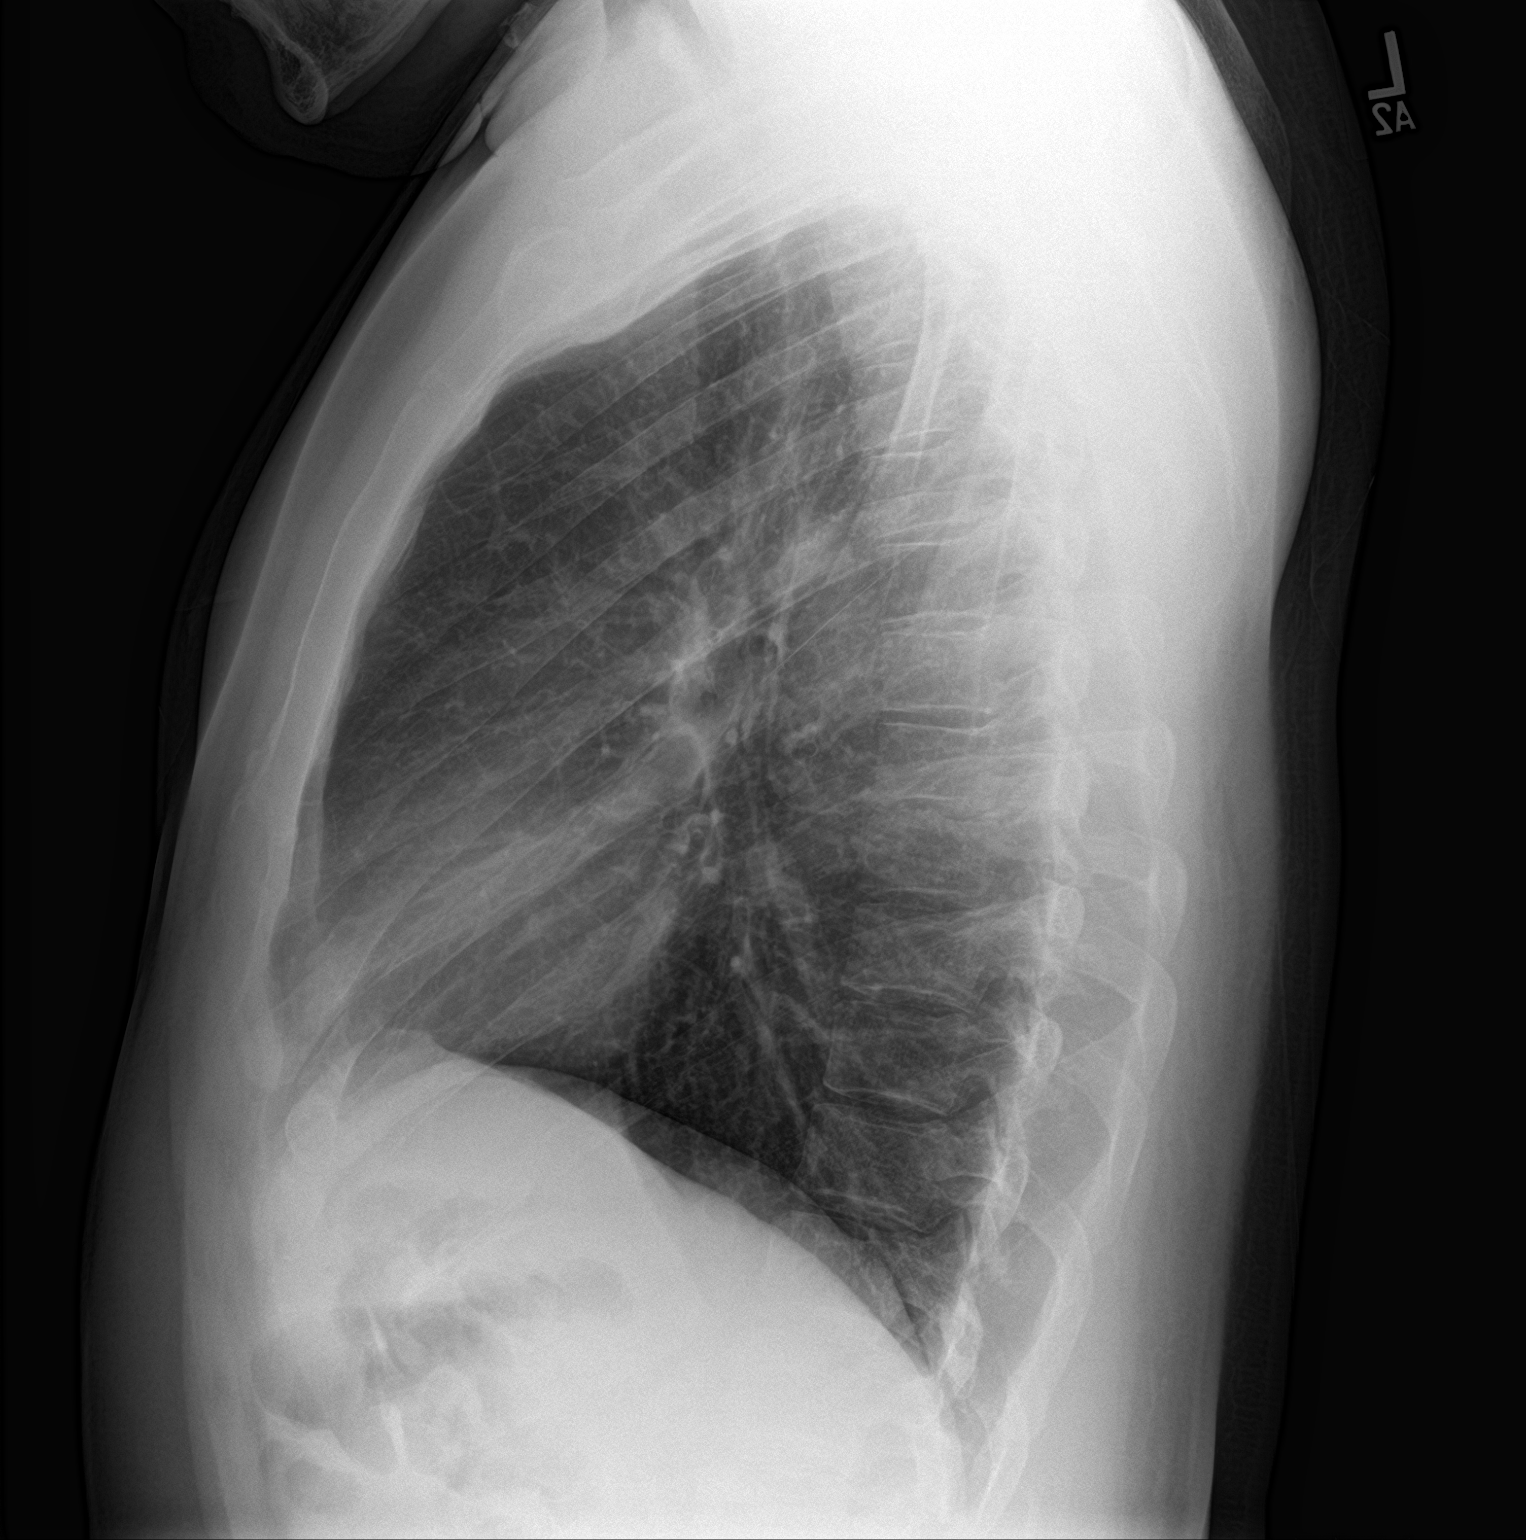

[2 of 2 positions shown; findings below may reference images not displayed]

FINDINGS: The heart size and mediastinal contours are within normal limits.
Both lungs are clear. No pneumothorax or pleural effusion is noted.
The visualized skeletal structures are unremarkable.
IMPRESSION: No active cardiopulmonary disease.

## 2018-07-01 IMAGING — DX DG CHEST 2V
2 series · 2 of 2 positions shown · non-contrast
Comparison: 10/20/2016

CLINICAL DATA: Asthma, shortness of Breath

EXAM:
CHEST  2 VIEW

[w chest pa]
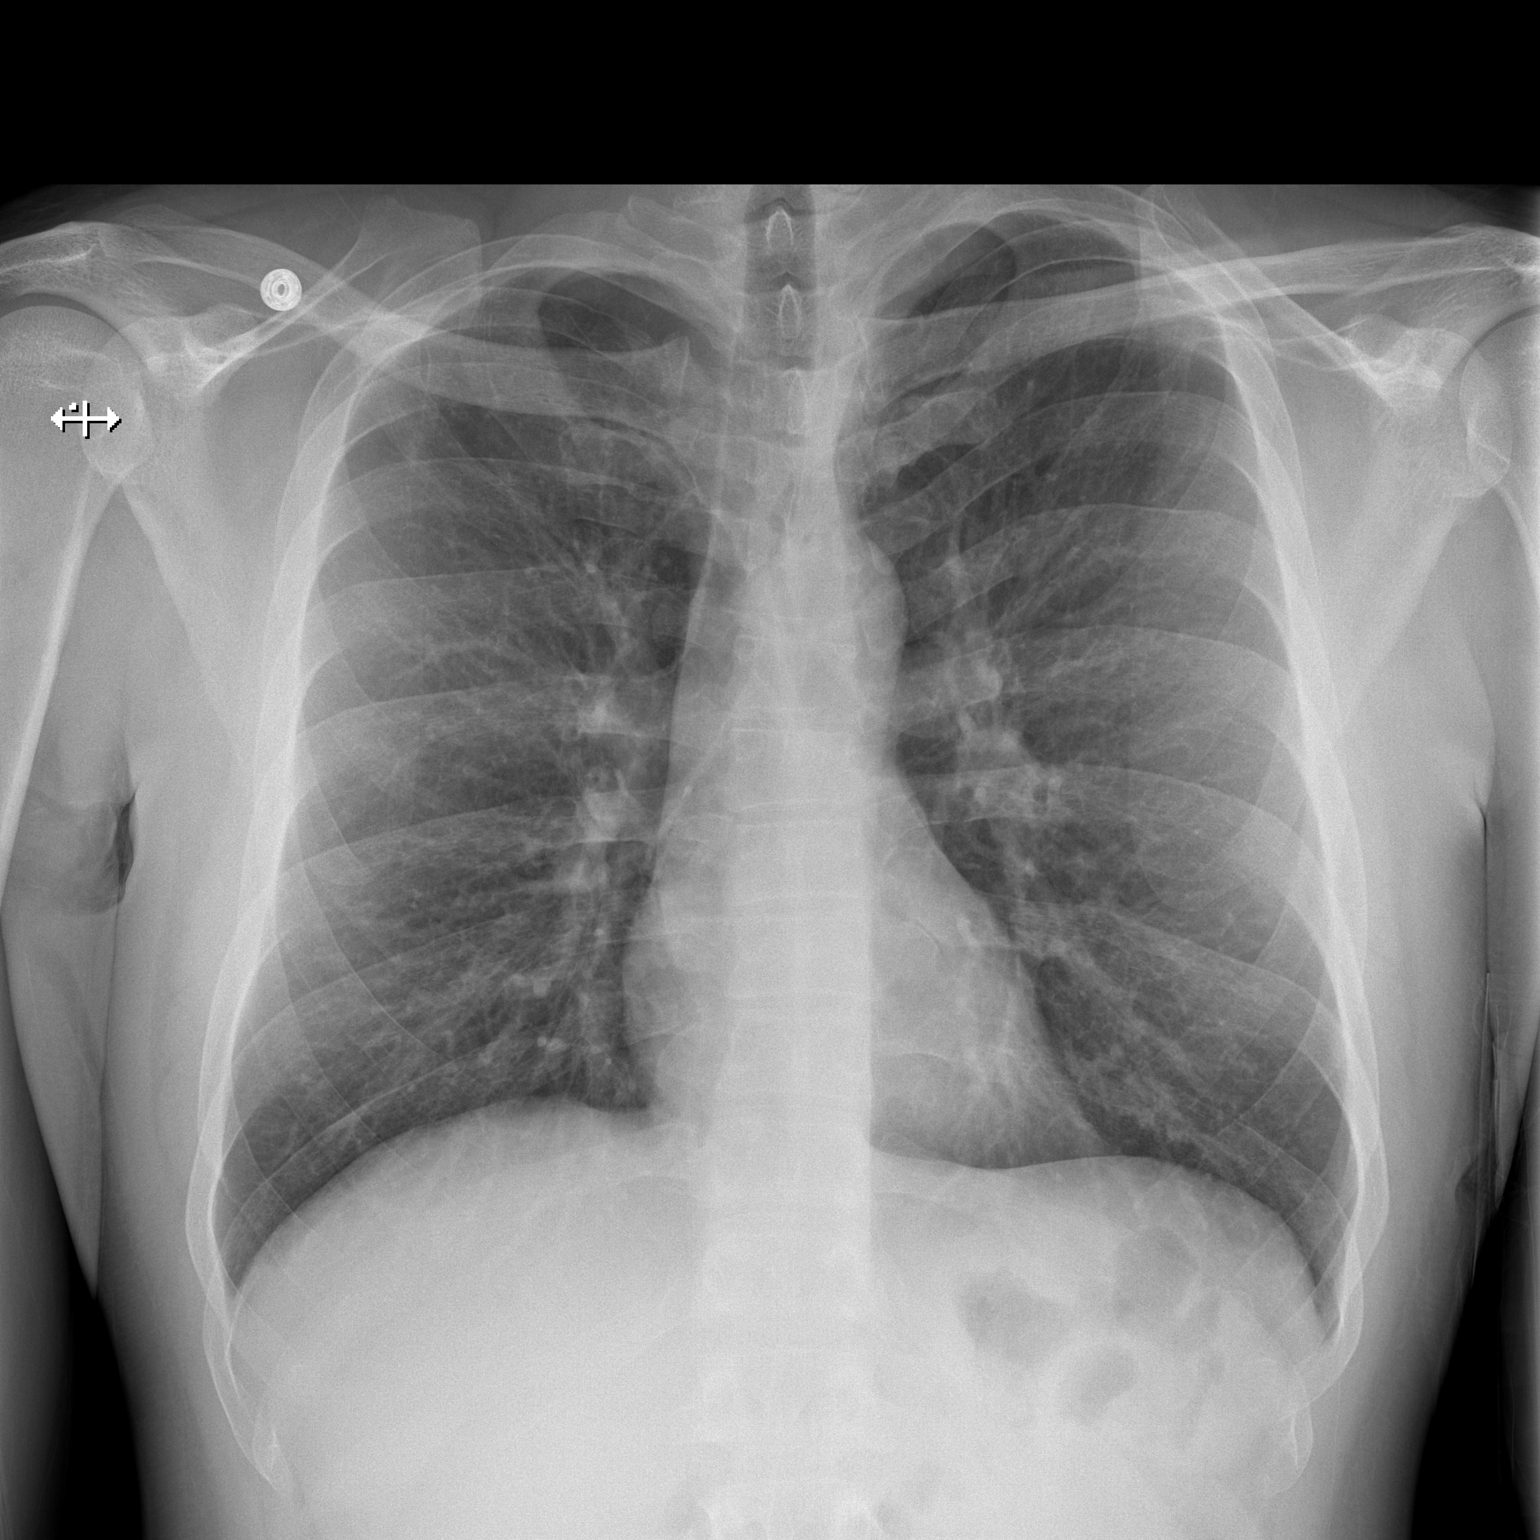

[w chest lat]
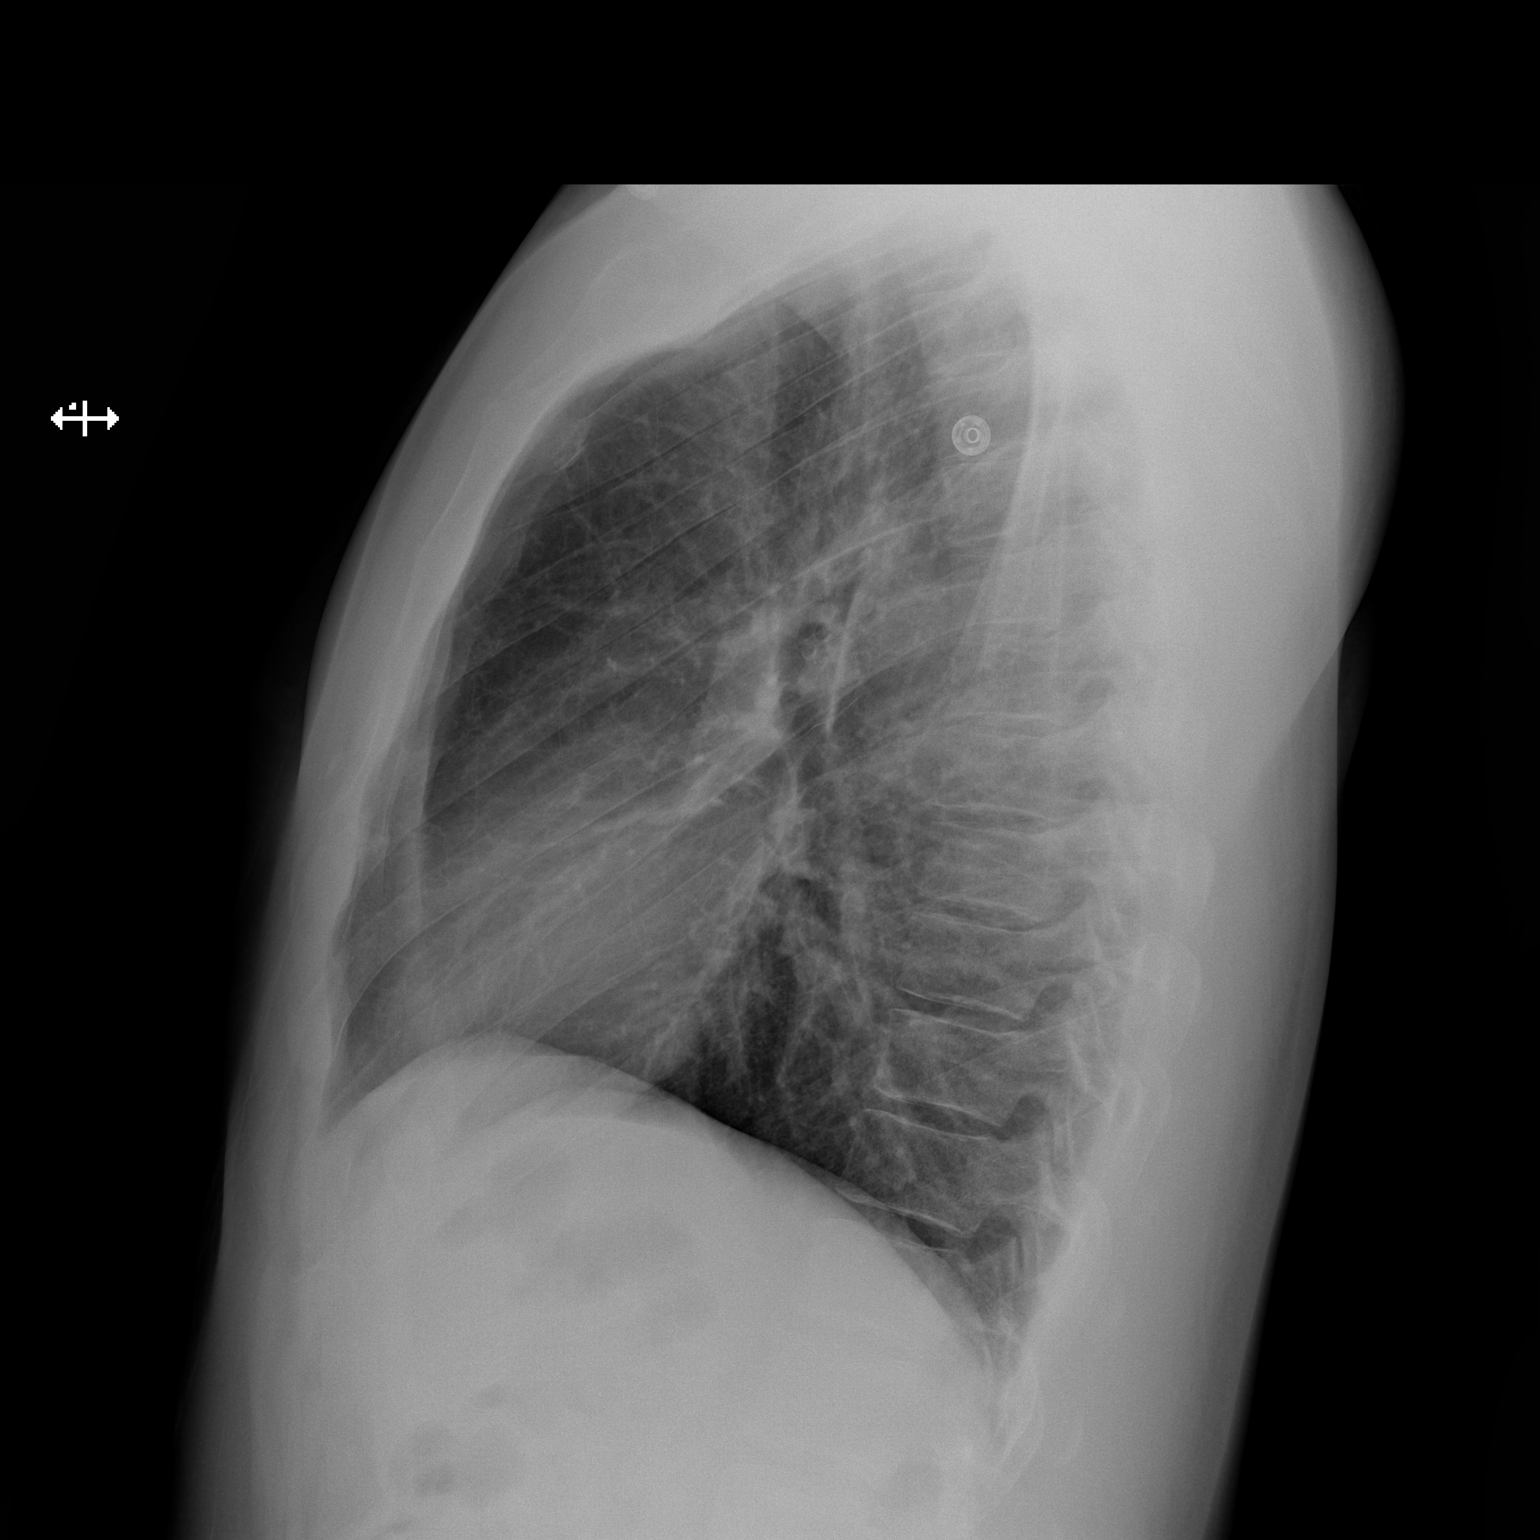

[2 of 2 positions shown; findings below may reference images not displayed]

FINDINGS: Heart and mediastinal contours are within normal limits. No focal
opacities or effusions. No acute bony abnormality.
IMPRESSION: No active cardiopulmonary disease.

## 2020-01-14 ENCOUNTER — Other Ambulatory Visit: Payer: Self-pay

## 2020-01-14 ENCOUNTER — Ambulatory Visit: Payer: Self-pay | Attending: Internal Medicine

## 2020-01-14 DIAGNOSIS — Z20822 Contact with and (suspected) exposure to covid-19: Secondary | ICD-10-CM

## 2020-01-15 LAB — NOVEL CORONAVIRUS, NAA: SARS-CoV-2, NAA: NOT DETECTED

## 2020-01-15 LAB — SARS-COV-2, NAA 2 DAY TAT
